# Patient Record
Sex: Male | Born: 1955 | Race: White | Hispanic: No | Marital: Single | Smoking: Never smoker
Health system: Southern US, Community
[De-identification: ages and names within clinical notes are randomized; demographics above are authoritative.]

## PROBLEM LIST (undated history)

## (undated) DIAGNOSIS — I10 Essential (primary) hypertension: Secondary | ICD-10-CM

## (undated) DIAGNOSIS — Z8547 Personal history of malignant neoplasm of testis: Secondary | ICD-10-CM

## (undated) DIAGNOSIS — C6290 Malignant neoplasm of unspecified testis, unspecified whether descended or undescended: Secondary | ICD-10-CM

## (undated) DIAGNOSIS — E88819 Insulin resistance, unspecified: Secondary | ICD-10-CM

## (undated) DIAGNOSIS — E8881 Metabolic syndrome: Secondary | ICD-10-CM

## (undated) DIAGNOSIS — E559 Vitamin D deficiency, unspecified: Secondary | ICD-10-CM

## (undated) DIAGNOSIS — E7800 Pure hypercholesterolemia, unspecified: Secondary | ICD-10-CM

## (undated) DIAGNOSIS — I82409 Acute embolism and thrombosis of unspecified deep veins of unspecified lower extremity: Secondary | ICD-10-CM

## (undated) HISTORY — DX: Acute embolism and thrombosis of unspecified deep veins of unspecified lower extremity: I82.409

## (undated) HISTORY — DX: Pure hypercholesterolemia, unspecified: E78.00

## (undated) HISTORY — DX: Insulin resistance, unspecified: E88.819

## (undated) HISTORY — DX: Vitamin D deficiency, unspecified: E55.9

## (undated) HISTORY — DX: Essential (primary) hypertension: I10

## (undated) HISTORY — DX: Personal history of malignant neoplasm of testis: Z85.47

## (undated) HISTORY — DX: Metabolic syndrome: E88.81

## (undated) HISTORY — DX: Malignant neoplasm of unspecified testis, unspecified whether descended or undescended: C62.90

---

## 2002-07-23 ENCOUNTER — Encounter: Payer: Self-pay | Admitting: Internal Medicine

## 2002-07-23 ENCOUNTER — Encounter: Admission: RE | Admit: 2002-07-23 | Discharge: 2002-07-23 | Payer: Self-pay | Admitting: Internal Medicine

## 2002-10-01 ENCOUNTER — Encounter: Payer: Self-pay | Admitting: Gastroenterology

## 2002-10-01 ENCOUNTER — Encounter: Admission: RE | Admit: 2002-10-01 | Discharge: 2002-10-01 | Payer: Self-pay | Admitting: Gastroenterology

## 2003-11-11 ENCOUNTER — Ambulatory Visit (HOSPITAL_COMMUNITY): Admission: RE | Admit: 2003-11-11 | Discharge: 2003-11-11 | Payer: Self-pay | Admitting: Gastroenterology

## 2003-11-11 ENCOUNTER — Encounter (INDEPENDENT_AMBULATORY_CARE_PROVIDER_SITE_OTHER): Payer: Self-pay | Admitting: Specialist

## 2005-09-06 ENCOUNTER — Encounter: Admission: RE | Admit: 2005-09-06 | Discharge: 2005-09-06 | Payer: Self-pay | Admitting: Internal Medicine

## 2006-08-01 ENCOUNTER — Encounter: Admission: RE | Admit: 2006-08-01 | Discharge: 2006-08-01 | Payer: Self-pay | Admitting: Internal Medicine

## 2010-01-26 ENCOUNTER — Encounter: Admission: RE | Admit: 2010-01-26 | Discharge: 2010-01-26 | Payer: Self-pay | Admitting: Internal Medicine

## 2010-05-14 ENCOUNTER — Other Ambulatory Visit: Payer: Self-pay | Admitting: Internal Medicine

## 2010-05-14 DIAGNOSIS — C6290 Malignant neoplasm of unspecified testis, unspecified whether descended or undescended: Secondary | ICD-10-CM

## 2010-05-14 DIAGNOSIS — I82409 Acute embolism and thrombosis of unspecified deep veins of unspecified lower extremity: Secondary | ICD-10-CM

## 2010-05-18 ENCOUNTER — Other Ambulatory Visit: Payer: Self-pay

## 2010-05-18 ENCOUNTER — Inpatient Hospital Stay: Admission: RE | Admit: 2010-05-18 | Payer: Self-pay | Source: Ambulatory Visit

## 2010-05-25 ENCOUNTER — Other Ambulatory Visit: Payer: Self-pay

## 2010-05-25 ENCOUNTER — Inpatient Hospital Stay: Admission: RE | Admit: 2010-05-25 | Payer: Self-pay | Source: Ambulatory Visit

## 2010-07-11 ENCOUNTER — Encounter: Payer: Self-pay | Admitting: Internal Medicine

## 2010-08-17 NOTE — Op Note (Signed)
NAMEBRAYSON, Aaron Hamilton NO.:  [id number]   MEDICAL RECORD NO.:  [id number]                   PATIENT TYPE:  AMB   LOCATION:  ENDO                                 FACILITY:  [facility]   PHYSICIAN:  Bernette Redbird, M.D.                DATE OF BIRTH:  [birth year]   DATE OF PROCEDURE:  11/11/2003  DATE OF DISCHARGE:                                 OPERATIVE REPORT   PROCEDURE:  Colonoscopy with polypectomy and biopsy.   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  Based on sigmoidoscopic evaluation in the past.  The  polyp had not been removed due to the patient being on Coumadin at the time  of his first recognized.  However, he comes in at this time for definitive  removal.   FINDINGS:  Multiple small polyps removed.   DESCRIPTION OF PROCEDURE:  The nature, purpose, and risks of the procedure  had been discussed with the patient who provided written consent.   The procedure was done without any IV sedation, per patient request since he  really did not have someone to take him home.  This had been previously  planned.  Digital examination showed an unremarkable prostate pad.  The  Olympus adjustable tension pediatric video colonoscope was able to be  advanced to the cecum with an acceptable degree of patient comfort, despite  the absence of sedation.  The cecum was identified by visualization, the  appendiceal orifice and the pullback was then performed.   In the proximal ascending colon, I encountered three polyps.  The first one  was a semipedunculated 2 to 3 mm polyp snared off and eventually recovered  for histologic analysis by suctioning through the scope.  The other two were  smaller sessile polyps removed by cold biopsy nearby.   Additional polyps were removed by snare technique from the distal rectum,  the larger one being probably 1 cm x 3-4 mm in size.  Also, there was  another small polyp  removed by cold biopsy technique from the transverse  colon and in fact I think there were two or three small sessile polyps in  that area.   I believe there was a minimal amount of sigmoid diverticular disease.  No  large polyps, cancer, colitis, or vascular malformations were noted.   Note that the patient had stopped his Coumadin several days prior to his  examination.   Retroflexion in the rectum prior to polyp removal disclosed no additional  findings as did reinspection of the rectum.   The patient tolerated the procedure well and there were no apparent  complications.   IMPRESSION:  1. Multiple colon polyps removed as described above, from both the colon and     the rectum (211.3, 211.4).  2. Possible mild sigmoid diverticulosis.   PLAN:  Await pathology results.  May  resume Coumadin, since no cautery was  used even at the snare sites, in several days.                                               Bernette Redbird, M.D.    RB/MEDQ  D:  11/11/2003  T:  11/12/2003  Job:  [id number]   cc:   Minerva Areola L. August Saucer, M.D.  [address] [address]  [place]  [place] [address]  Fax: [phone]

## 2010-09-12 ENCOUNTER — Other Ambulatory Visit: Payer: Self-pay | Admitting: Gastroenterology

## 2011-05-07 LAB — HEMOGLOBIN A1C: Hgb A1c MFr Bld: 6.9 % — AB (ref 4.0–6.0)

## 2011-05-07 LAB — LIPID PANEL
Cholesterol: 273 mg/dL — AB (ref 0–200)
HDL: 32 mg/dL — AB (ref 35–70)
LDl/HDL Ratio: 8.5

## 2011-05-07 LAB — BASIC METABOLIC PANEL: BUN: 14 mg/dL (ref 4–21)

## 2011-05-07 LAB — HEPATIC FUNCTION PANEL: ALT: 20 U/L (ref 10–40)

## 2011-05-07 LAB — CBC AND DIFFERENTIAL
HCT: 45 % (ref 41–53)
Hemoglobin: 14.8 g/dL (ref 13.5–17.5)
WBC: 7.9 10^3/mL

## 2011-08-09 IMAGING — CR DG LUMBAR SPINE COMPLETE 4+V
5 series · 5 of 5 positions shown · non-contrast
Comparison: CT abdomen pelvis 07/23/2002 and chest x-ray 08/01/2006

CLINICAL DATA: Back pain.  Recent fall.

LUMBAR SPINE - COMPLETE 4+ VIEW

[t l-spine a.p.]
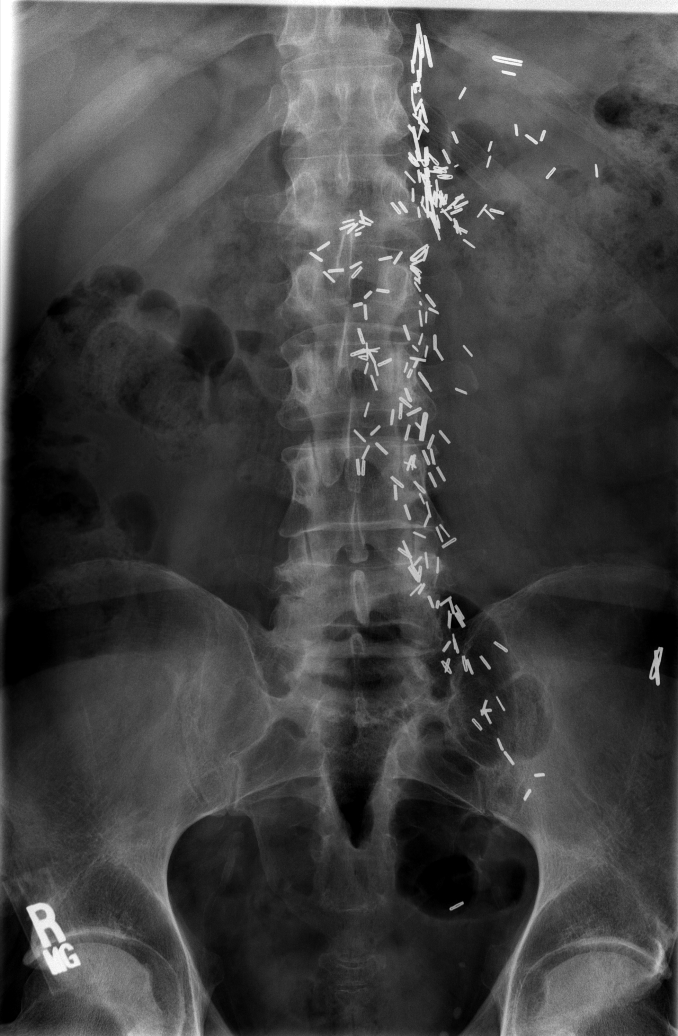

[t l-spine oblique exposure (1 of 2)]
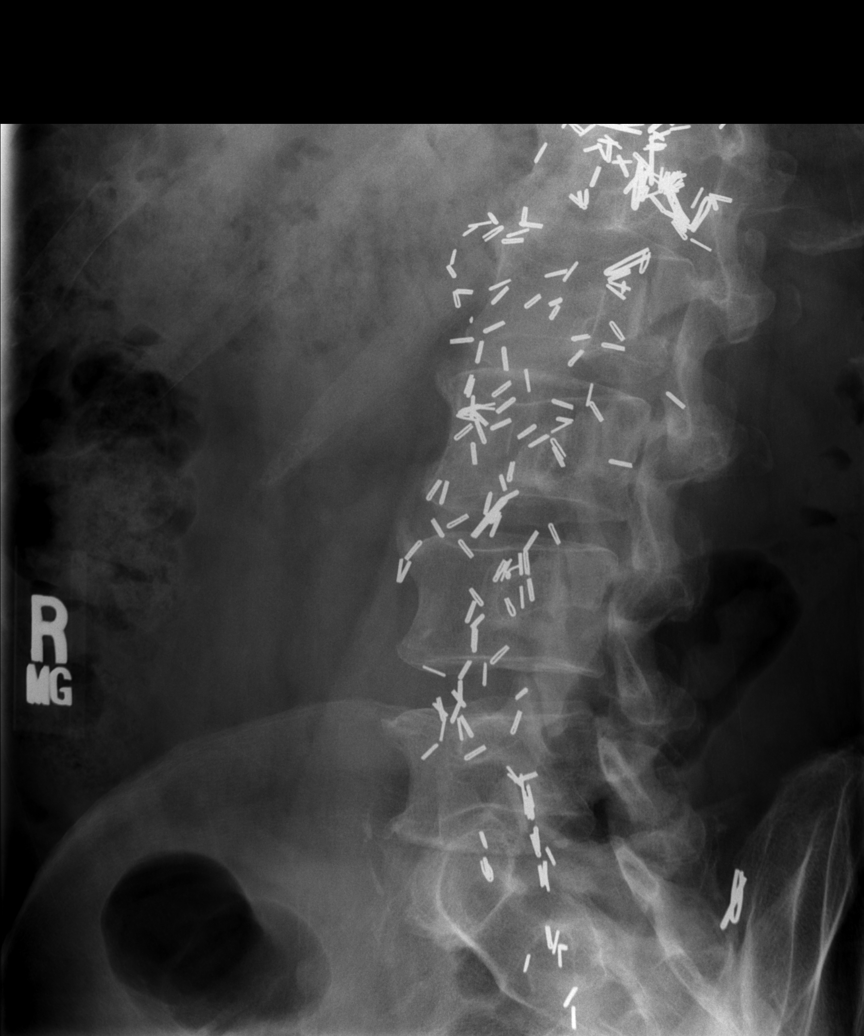

[t l-spine oblique exposure (2 of 2)]
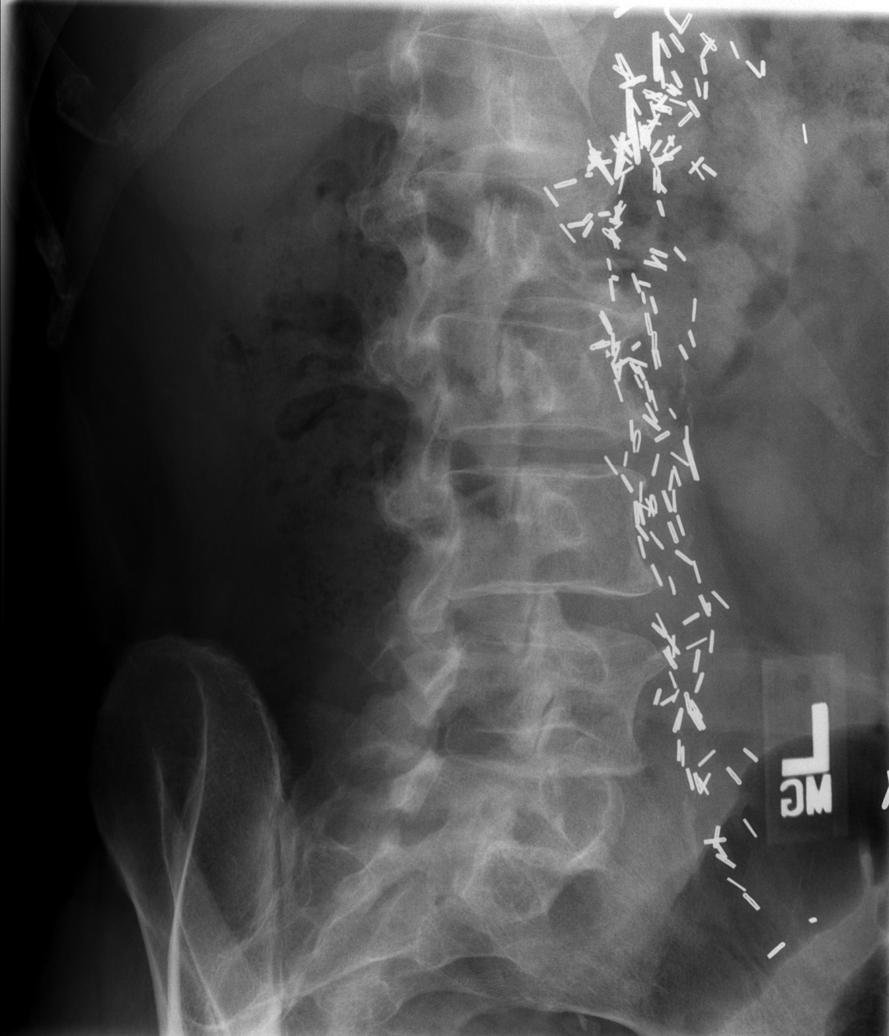

[t l-spine lat]
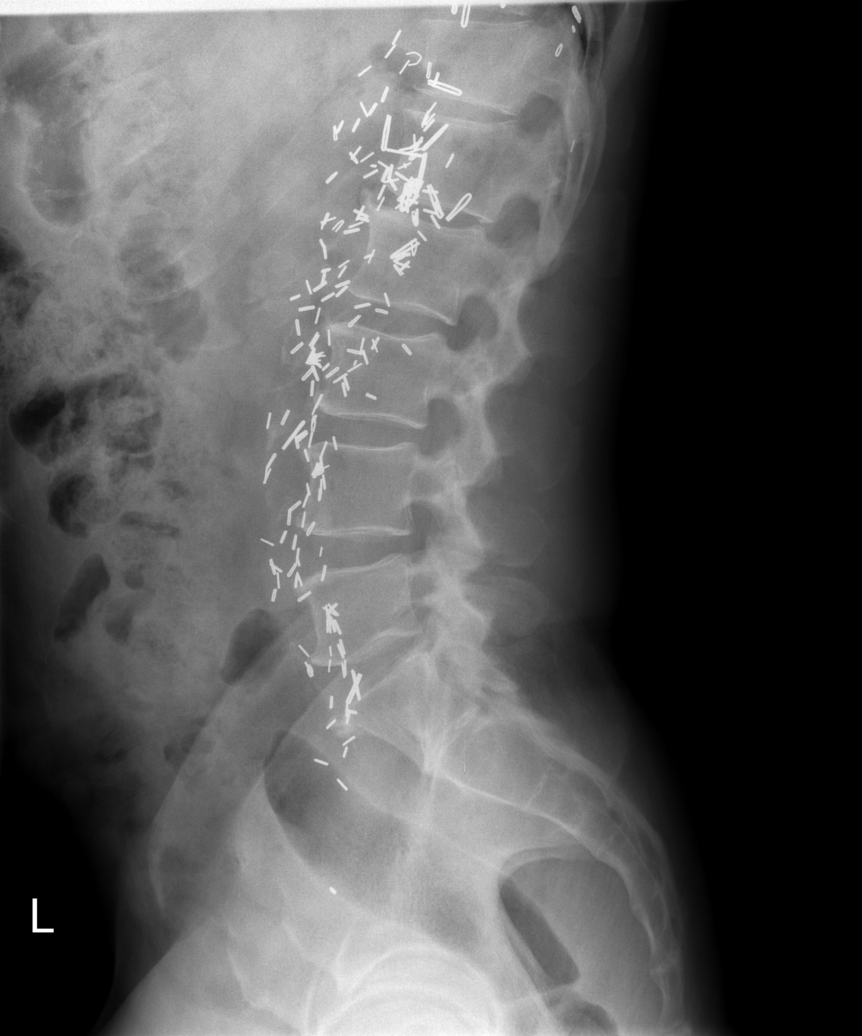

[t l-spine l5-s1 spot]
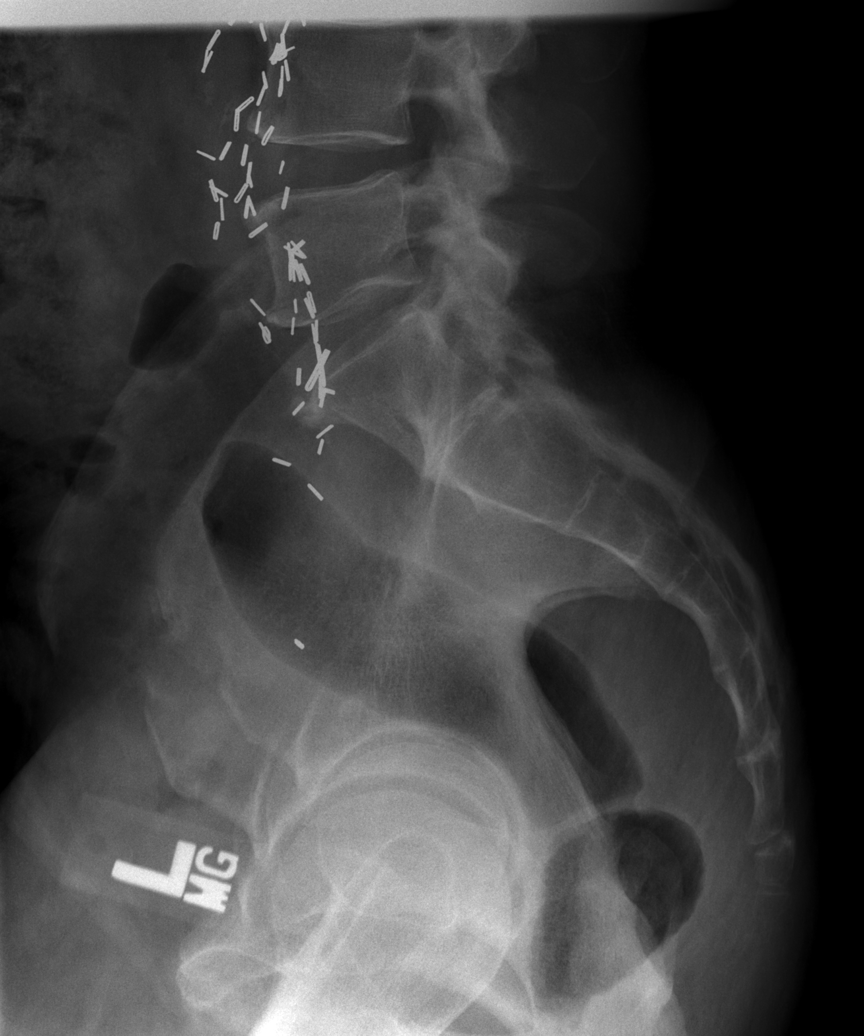

[5 of 5 positions shown; findings below may reference images not displayed]

FINDINGS: There are numerous surgical clips in the left
retroperitoneum.  Lumbar spine vertebral bodies are normal in
height and alignment.  There is slight disc space narrowing at L1-
L2.  The remainder the disc spaces are preserved.  There is very
mild anterior wedging of the T12 vertebral body.  It is uncertain
if this is an interval change compared to the chest radiograph of
08/01/2006 (T12 vertebral body is partially obscured by the
hemidiaphragm and surgical clips on the prior study).  No pars
defect is identified.  Visualized bowel gas pattern is normal.
IMPRESSION: 1.  No acute abnormality or significant degenerative change of the
lumbar spine.
2.  Probable mild anterior wedge deformity of the T12 vertebral
body.  Consider dedicated thoracic spine radiographs as clinically
indicated.
3.  Numerous retroperitoneal surgical clips.

## 2012-03-16 ENCOUNTER — Encounter: Payer: Self-pay | Admitting: Hematology

## 2014-10-14 ENCOUNTER — Other Ambulatory Visit: Payer: Self-pay | Admitting: Surgery

## 2014-10-14 ENCOUNTER — Encounter: Payer: Self-pay | Admitting: Surgery

## 2014-10-14 DIAGNOSIS — Z860100 Personal history of colon polyps, unspecified: Secondary | ICD-10-CM | POA: Insufficient documentation

## 2014-10-14 NOTE — H&P (Signed)
Aaron Hamilton. Commins 10/14/2014 1:57 PM Location: [facility] Patient #: [id number] DOB: 1955-06-24 Married / Language: English / Race: White Male History of Present Illness Aaron Hector MD; 10/14/2014 5:29 PM) Patient words: hernia.  The patient is a 59 year old male who presents with an inguinal hernia. Pleasant male sent by his primary care physician for surgical consultation concern for recurrent RIGHT internal hernia. Pleasant active male. History of testicular cancer. Had large open retroperitoneal lymph node dissection done in 1984. LEFT orchiectomy as well. Disease-free. Last CT scan I just reviewed shows no recurrence 2012. Some subtle inguinal hernias may be present.  Patient did have a RIGHT inguinal hernia. 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 Patient noticed some recurrent bulging and also some burning RIGHT groin radiating to thigh pain in the past few weeks/months. Concerned. Primary care physician concern for hernia. Surgical consultation requested. Can walk about 20 minutes without difficulty. History of colon polyps followed by Dr. Cristina Gong with [facility] gastroenterology. No major problems with urination. Weight has been stable. Activity level good. No other major abdominal surgery. 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 No major events with that. He does not have a pulmonologist, hematologist, nor a cardiologist. Other Problems Aaron Hamilton, [name]; 10/14/2014 1:57 PM) Arthritis Cancer Diabetes Mellitus Hypercholesterolemia Inguinal Hernia Migraine Headache Pulmonary Embolism / Blood Clot in Legs  Past Surgical History Aaron Hamilton, [name]; 10/14/2014 1:57 PM) Colon Polyp Removal - Colonoscopy Open Inguinal Hernia Surgery Right. Sentinel Lymph Node Biopsy  Diagnostic Studies History Aaron Hamilton, [place]; 10/14/2014 1:57  PM) Colonoscopy 1-5 years ago  Allergies Aaron Hamilton, [name]; 10/14/2014 1:58 PM) No Known Drug Allergies 10/14/2014  Medication History Aaron Hamilton, [name]; 10/14/2014 1:59 PM) Warfarin Sodium (7.5MG  Tablet, Oral) Active. Metoprolol Tartrate (50MG  Tablet, Oral) Active. Vitamin D3 (10000UNIT Capsule, Oral) Active. Osteo Bi-Flex Adv Triple St (Oral) Active. B Complex Vitamins (Oral) Active. Medications Reconciled  Social History Aaron Hamilton, [place]; 10/14/2014 1:57 PM) Alcohol use Remotely quit alcohol use. Caffeine use Coffee, Tea. Illicit drug use Remotely quit drug use. Tobacco use Never smoker.  Family History Aaron Hamilton, [place]; 10/14/2014 1:57 PM) Arthritis Brother, Sister. Bleeding disorder Sister. Cancer Father. Cerebrovascular Accident Brother. Colon Polyps Sister. Depression Brother. Diabetes Mellitus Family Members In General. Heart Disease Brother. Heart disease in male family member before age 21 Hypertension Family Members In General, Mother. Ischemic Bowel Disease Brother. Kidney Disease Brother, Sister. Migraine Headache Father, Mother, Sister. Respiratory Condition Sister. Seizure disorder Sister.     Review of Systems Aaron Hamilton [name]; 10/14/2014 1:57 PM) General Not Present- Appetite Loss, Chills, Fatigue, Fever, Night Sweats, Weight Gain and Weight Loss. HEENT Present- Ringing in the Ears, Seasonal Allergies and Visual Disturbances. Not Present- Earache, Hearing Loss, Hoarseness, Nose Bleed, Oral Ulcers, Sinus Pain, Sore Throat, Wears glasses/contact lenses and Yellow Eyes. Respiratory Present- Wheezing. Not Present- Bloody sputum, Chronic Cough, Difficulty Breathing and Snoring. Breast Not Present- Breast Mass, Breast Pain, Nipple Discharge and Skin Changes. Cardiovascular Present- Swelling of Extremities. Not Present- Chest Pain, Difficulty Breathing Lying Down, Leg Cramps, Palpitations, Rapid Heart Rate and Shortness of  Breath. Gastrointestinal Present- Bloating, Change in Bowel Habits and Constipation. Not Present- Abdominal Pain, Bloody Stool, Chronic diarrhea, Difficulty Swallowing, Excessive gas, Gets full quickly at meals, Hemorrhoids, Indigestion, Nausea, Rectal Pain and Vomiting. Musculoskeletal Present- Joint Pain and Swelling of Extremities. Not Present- Back Pain, Joint Stiffness, Muscle  Pain and Muscle Weakness. Neurological Present- Tingling. Not Present- Decreased Memory, Fainting, Headaches, Numbness, Seizures, Tremor, Trouble walking and Weakness. Psychiatric Not Present- Anxiety, Bipolar, Change in Sleep Pattern, Depression, Fearful and Frequent crying. Endocrine Present- New Diabetes. Not Present- Cold Intolerance, Excessive Hunger, Hair Changes, Heat Intolerance and Hot flashes. Hematology Present- Easy Bruising and Excessive bleeding. Not Present- Gland problems, HIV and Persistent Infections.  Vitals Aaron Hamilton [name]; 10/14/2014 1:59 PM) 10/14/2014 1:59 PM Weight: 178 lb Height: 72in Body Surface Area: 2.03 m Body Mass Index: 24.14 kg/m Temp.: 98.20F(Oral)  Pulse: 80 (Regular)  BP: 130/70 (Sitting, Left Arm, Standard)     Physical Exam Aaron Hector MD; 10/14/2014 2:38 PM)  General Mental Status-Alert. General Appearance-Not in acute distress, Not Sickly. Orientation-Oriented X3. Hydration-Well hydrated. Voice-Normal.  Integumentary Global Assessment Upon inspection and palpation of skin surfaces of the - Axillae: non-tender, no inflammation or ulceration, no drainage. and Distribution of scalp and body hair is normal. General Characteristics Temperature - normal warmth is noted.  Head and Neck Head-normocephalic, atraumatic with no lesions or palpable masses. Face Global Assessment - atraumatic, no absence of expression. Neck Global Assessment - no abnormal movements, no bruit auscultated on the right, no bruit auscultated on the left, no  decreased range of motion, non-tender. Trachea-midline. Thyroid Gland Characteristics - non-tender.  Eye Eyeball - Left-Extraocular movements intact, No Nystagmus. Eyeball - Right-Extraocular movements intact, No Nystagmus. Cornea - Left-No Hazy. Cornea - Right-No Hazy. Sclera/Conjunctiva - Left-No scleral icterus, No Discharge. Sclera/Conjunctiva - Right-No scleral icterus, No Discharge. Pupil - Left-Direct reaction to light normal. Pupil - Right-Direct reaction to light normal.  ENMT Ears Pinna - Left - no drainage observed, no generalized tenderness observed. Right - no drainage observed, no generalized tenderness observed. Nose and Sinuses External Inspection of the Nose - no destructive lesion observed. Inspection of the nares - Left - quiet respiration. Right - quiet respiration. Mouth and Throat Lips - Upper Lip - no fissures observed, no pallor noted. Lower Lip - no fissures observed, no pallor noted. Nasopharynx - no discharge present. Oral Cavity/Oropharynx - Tongue - no dryness observed. Oral Mucosa - no cyanosis observed. Hypopharynx - no evidence of airway distress observed.  Chest and Lung Exam Inspection Movements - Normal and Symmetrical. Accessory muscles - No use of accessory muscles in breathing. Palpation Palpation of the chest reveals - Non-tender. Auscultation Breath sounds - Normal and Clear.  Cardiovascular Auscultation Rhythm - Regular. Murmurs & Other Heart Sounds - Auscultation of the heart reveals - No Murmurs and No Systolic Clicks.  Abdomen Inspection Inspection of the abdomen reveals - No Visible peristalsis and No Abnormal pulsations. Umbilicus - No Bleeding, No Urine drainage. Palpation/Percussion Palpation and Percussion of the abdomen reveal - Soft, Non Tender, No Rebound tenderness, No Rigidity (guarding) and No Cutaneous hyperesthesia.   Male Genitourinary Sexual Maturity Tanner 5 - Adult hair pattern and Adult penile  size and shape. Note: Normal external male genitalia. Circumcised. LEFT testicle absent. Groin bulging RIGHT greater than LEFT with Valsalva. Seems more inferior sent consistent with femoral hernias.   Peripheral Vascular Upper Extremity Inspection - Left - No Cyanotic nailbeds, Not Ischemic. Right - No Cyanotic nailbeds, Not Ischemic.  Neurologic Neurologic evaluation reveals -normal attention span and ability to concentrate, able to name objects and repeat phrases. Appropriate fund of knowledge , normal sensation and normal coordination. Mental Status Affect - not angry, not paranoid. Cranial Nerves-Normal Bilaterally. Gait-Normal.  Neuropsychiatric Mental status exam performed with findings of-able to articulate  well with normal speech/language, rate, volume and coherence, thought content normal with ability to perform basic computations and apply abstract reasoning and no evidence of hallucinations, delusions, obsessions or homicidal/suicidal ideation.  Musculoskeletal Global Assessment Spine, Ribs and Pelvis - no instability, subluxation or laxity. Right Upper Extremity - no instability, subluxation or laxity. Note: No edema. Slightly enlarged RIGHT lower extremity greater than LEFT.   Lymphatic Head & Neck  General Head & Neck Lymphatics: Bilateral - Description - No Localized lymphadenopathy. Axillary  General Axillary Region: Bilateral - Description - No Localized lymphadenopathy. Femoral & Inguinal  Generalized Femoral & Inguinal Lymphatics: Left - Description - No Localized lymphadenopathy. Right - Description - No Localized lymphadenopathy.    Assessment & Plan Aaron Hector MD; 10/14/2014 5:32 PM)  BILATERAL FEMORAL HERNIA WITHOUT OBSTRUCTION OR GANGRENE, RECURRENCE NOT SPECIFIED (553.02  K41.20) Impression: Brief he has bilateral groin bulges. I suspect there femoral hernias given the lower bulging in his history of burning and pain down the RIGHT  side. Could be recurrent inguinal hernia from prior open repair. Most likely increased conversion or at least longer operative time given the significant LEFT sided retroperitoneal dissection and lymph node biopsy.  I think he would benefit from surgical repair. Usually do laparoscopic approach given prior open repair. Would allow bilateral exploration. Increasing hematoma bruising risks with preperitoneal approach given history of warfarin.  As long as he can come off his warfarin safely perioperatively, would proceed with surgery. He is interested. In August.  Current Plans Schedule for Surgery Written instructions provided The anatomy & physiology of the abdominal wall and pelvic floor was discussed. The pathophysiology of hernias in the inguinal and pelvic region was discussed. Natural history risks such as progressive enlargement, pain, incarceration, and strangulation was discussed. Contributors to complications such as smoking, obesity, diabetes, prior surgery, etc were discussed.  I feel the risks of no intervention will lead to serious problems that outweigh the operative risks; therefore, I recommended surgery to reduce and repair the hernia. I explained laparoscopic techniques with possible need for an open approach. I noted usual use of mesh to patch and/or buttress hernia repair  Risks such as bleeding, infection, abscess, need for further treatment, heart attack, death, and other risks were discussed. I noted a good likelihood this will help address the problem. Goals of post-operative recovery were discussed as well. Possibility that this will not correct all symptoms was explained. I stressed the importance of low-impact activity, aggressive pain control, avoiding constipation, & not pushing through pain to minimize risk of post-operative chronic pain or injury. Possibility of reherniation was discussed. We will work to minimize complications.  An educational handout further explaining  the pathology & treatment options was given as well. Questions were answered. The patient expresses understanding & wishes to proceed with surgery. Pt Education - Pamphlet Given - Laparoscopic Hernia Repair: discussed with patient and provided information. Pt Education - [facility] Hernia Post-Op HCI (Adalbert Alberto): discussed with patient and provided information. Pt Education - [facility] Good Bowel Health (Marquese Burkland) Pt Education - [facility] Pain Control (Alysandra Lobue) Pt Education - [facility] Pelvic Floor Exercises (Kegels) and Dysfunction HCI (Mellonie Guess)  Aaron Hamilton, M.D., F.A.C.S. Gastrointestinal and Minimally Invasive Surgery [facility] [address], [place] [address], [place] [id number] [phone] Main / Paging

## 2014-11-18 ENCOUNTER — Other Ambulatory Visit: Payer: Self-pay | Admitting: Cardiology

## 2014-11-18 DIAGNOSIS — R071 Chest pain on breathing: Secondary | ICD-10-CM

## 2014-11-28 ENCOUNTER — Encounter (HOSPITAL_COMMUNITY): Payer: Self-pay

## 2014-11-28 ENCOUNTER — Ambulatory Visit (HOSPITAL_COMMUNITY): Payer: Self-pay
